# Patient Record
Sex: Male | Born: 1995 | Race: Black or African American | Hispanic: No | Marital: Single | Smoking: Never smoker
Health system: Southern US, Community
[De-identification: ages and names within clinical notes are randomized; demographics above are authoritative.]

## PROBLEM LIST (undated history)

## (undated) DIAGNOSIS — F909 Attention-deficit hyperactivity disorder, unspecified type: Secondary | ICD-10-CM

## (undated) DIAGNOSIS — F4310 Post-traumatic stress disorder, unspecified: Secondary | ICD-10-CM

## (undated) DIAGNOSIS — I10 Essential (primary) hypertension: Secondary | ICD-10-CM

## (undated) HISTORY — DX: Essential (primary) hypertension: I10

---
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
## 2017-03-15 IMAGING — US US ABDOMEN LIMITED
1 series · 14 of 25 positions shown · non-contrast
Comparison: None.

CLINICAL DATA: Elevated transaminase.

EXAM:
US ABDOMEN LIMITED - RIGHT UPPER QUADRANT

[Series 1: us abdomen limited · 0.32mm/px · 14 of 46 slices shown]
[im 1/46]
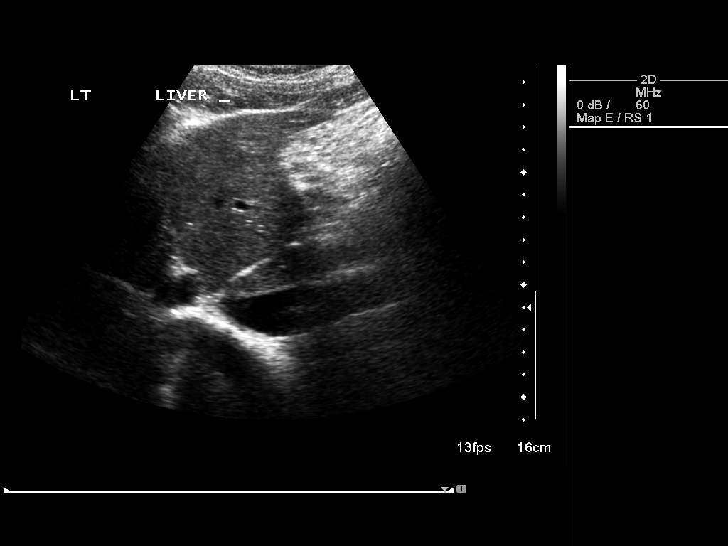
[im 4/46]
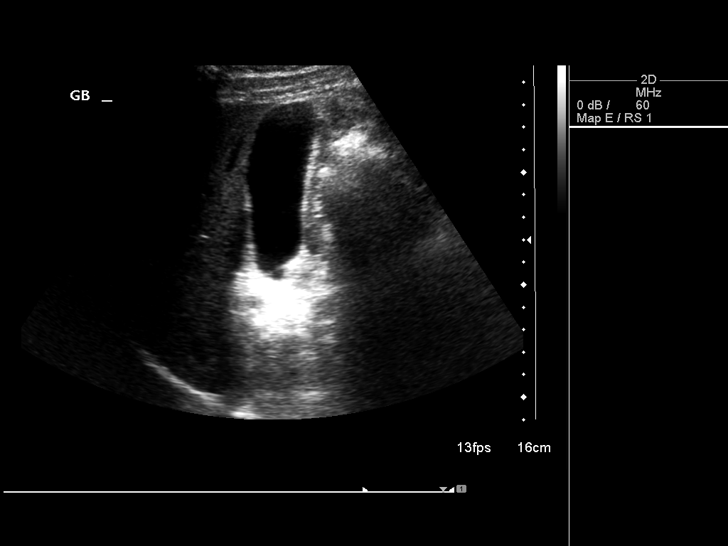
[im 8/46]
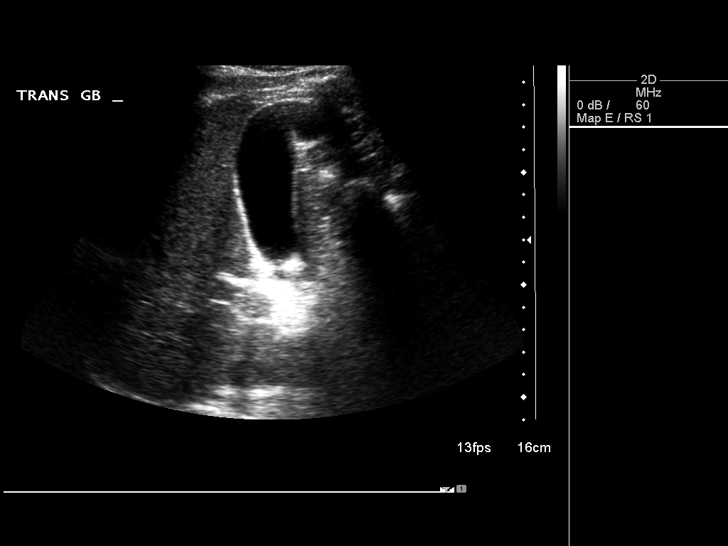
[im 12/46]
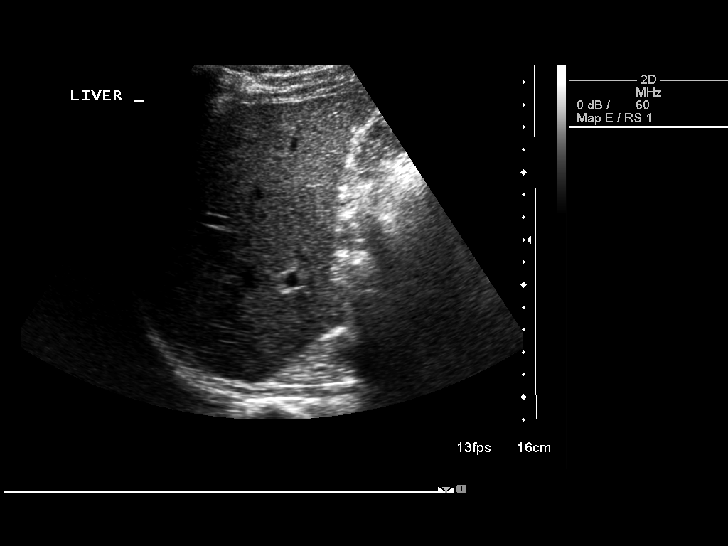
[im 16/46]
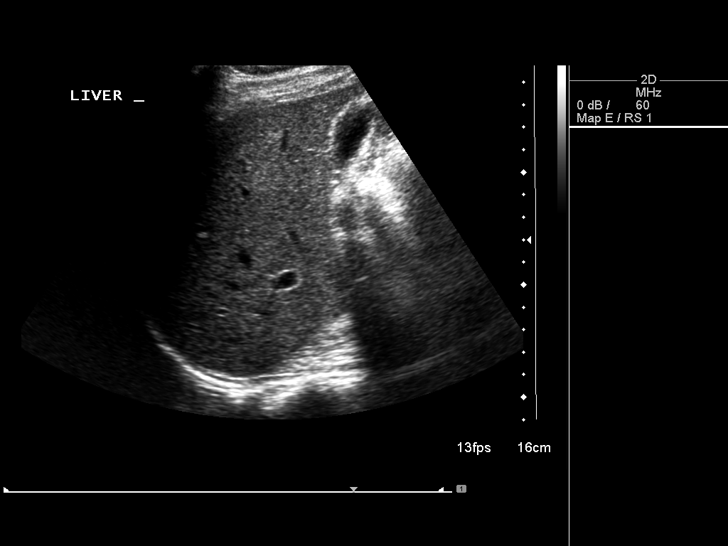
[im 17/46]
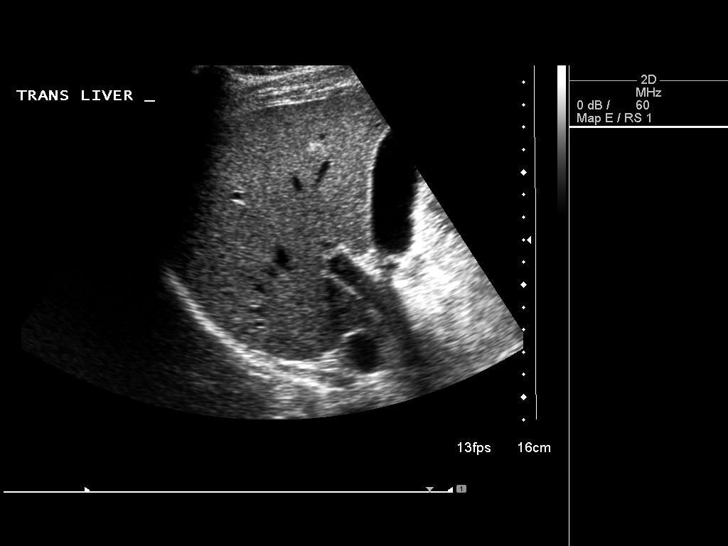
[im 21/46]
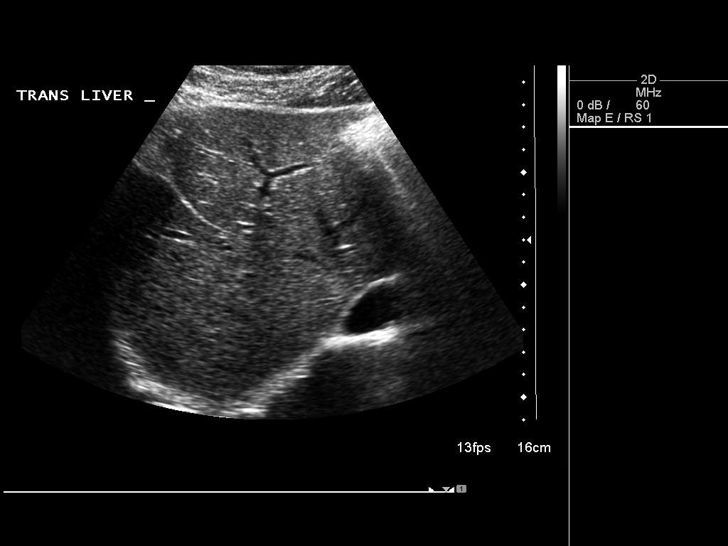
[im 25/46]
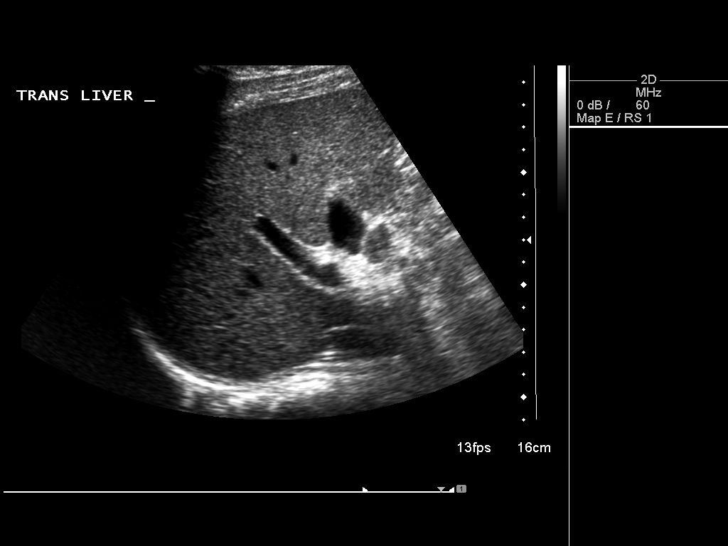
[im 29/46]
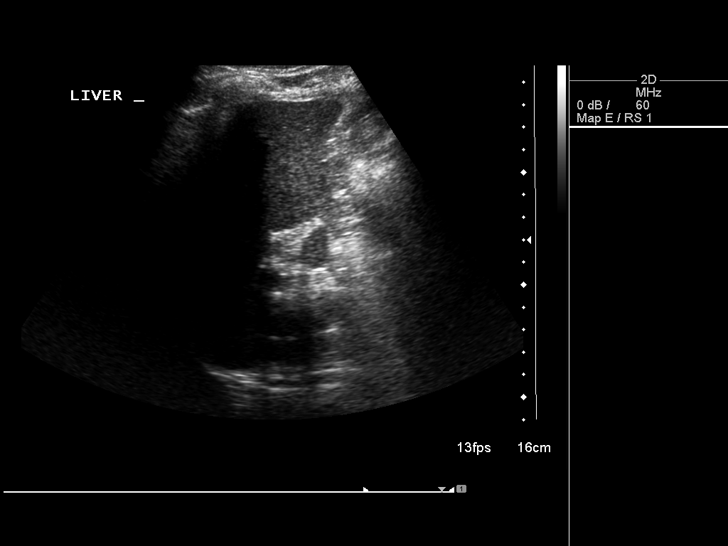
[im 31/46]
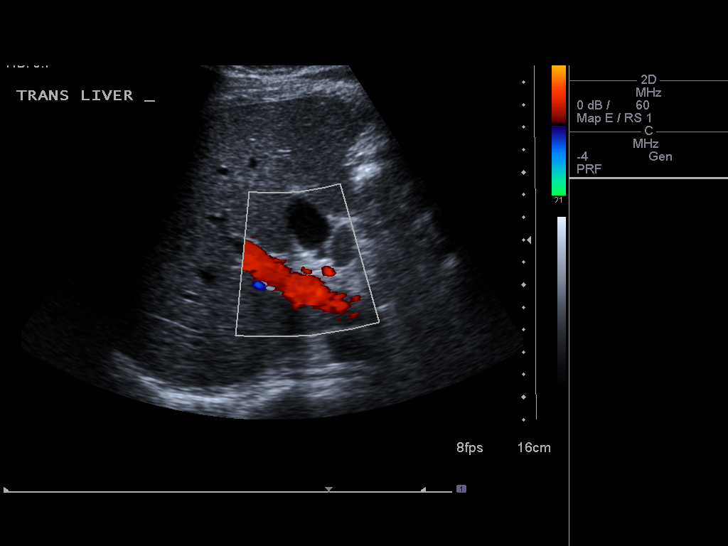
[im 34/46]
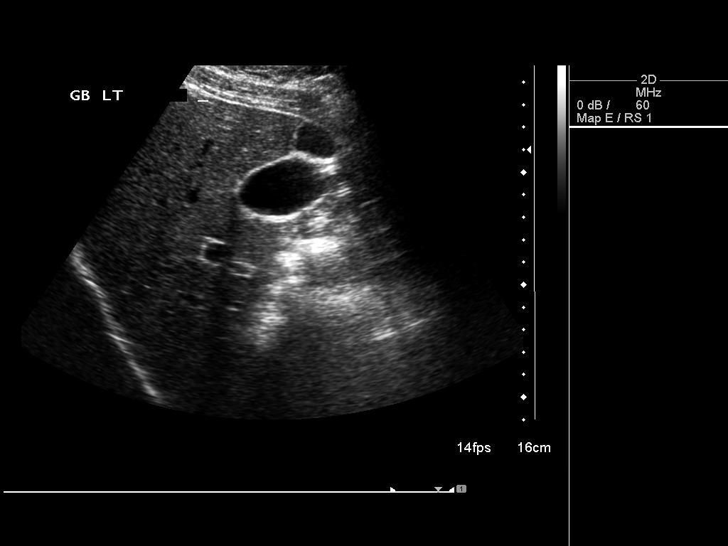
[im 38/46]
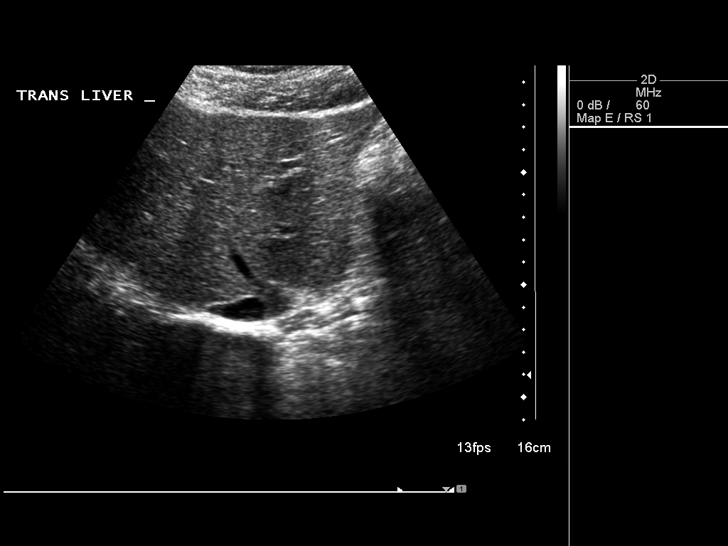
[im 42/46]
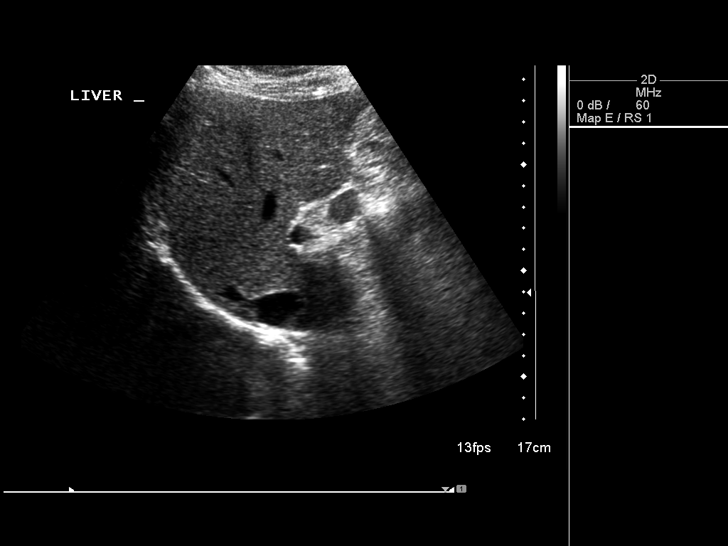
[im 46/46]
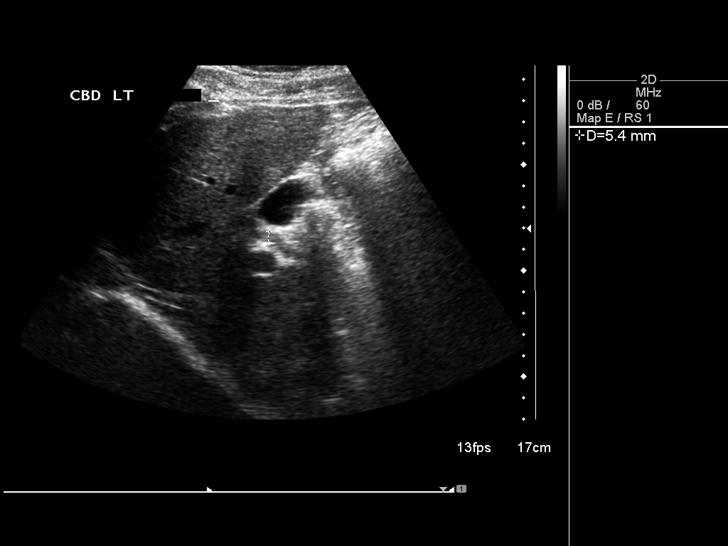

[14 of 25 positions shown; findings below may reference images not displayed]

FINDINGS: Gallbladder:

Gallbladder has a normal appearance. Gallbladder wall is 1.6 mm,
within normal limits. No stones or pericholecystic fluid. No
sonographic Murphy's sign.

Common bile duct:

Diameter: 5.4 mm

Liver:

Liver is homogeneous in echotexture. No focal liver lesions are
identified.

Within the porta hepatis there is a 2 cm solid oval mass consistent
with porta hepatis node.
IMPRESSION: 1. No evidence for acute cholecystitis.
2. No focal liver lesions.
3. 2.0 cm porta hepatis node warrants further evaluation. CT of the
abdomen and pelvis with contrast is recommended.

## 2017-07-23 IMAGING — DX DG HAND COMPLETE 3+V*R*
3 series · 3 of 3 positions shown · non-contrast
Comparison: None.

CLINICAL DATA: Trauma, abrasions to 2nd digit

EXAM:
RIGHT HAND - COMPLETE 3+ VIEW

[hand pa]
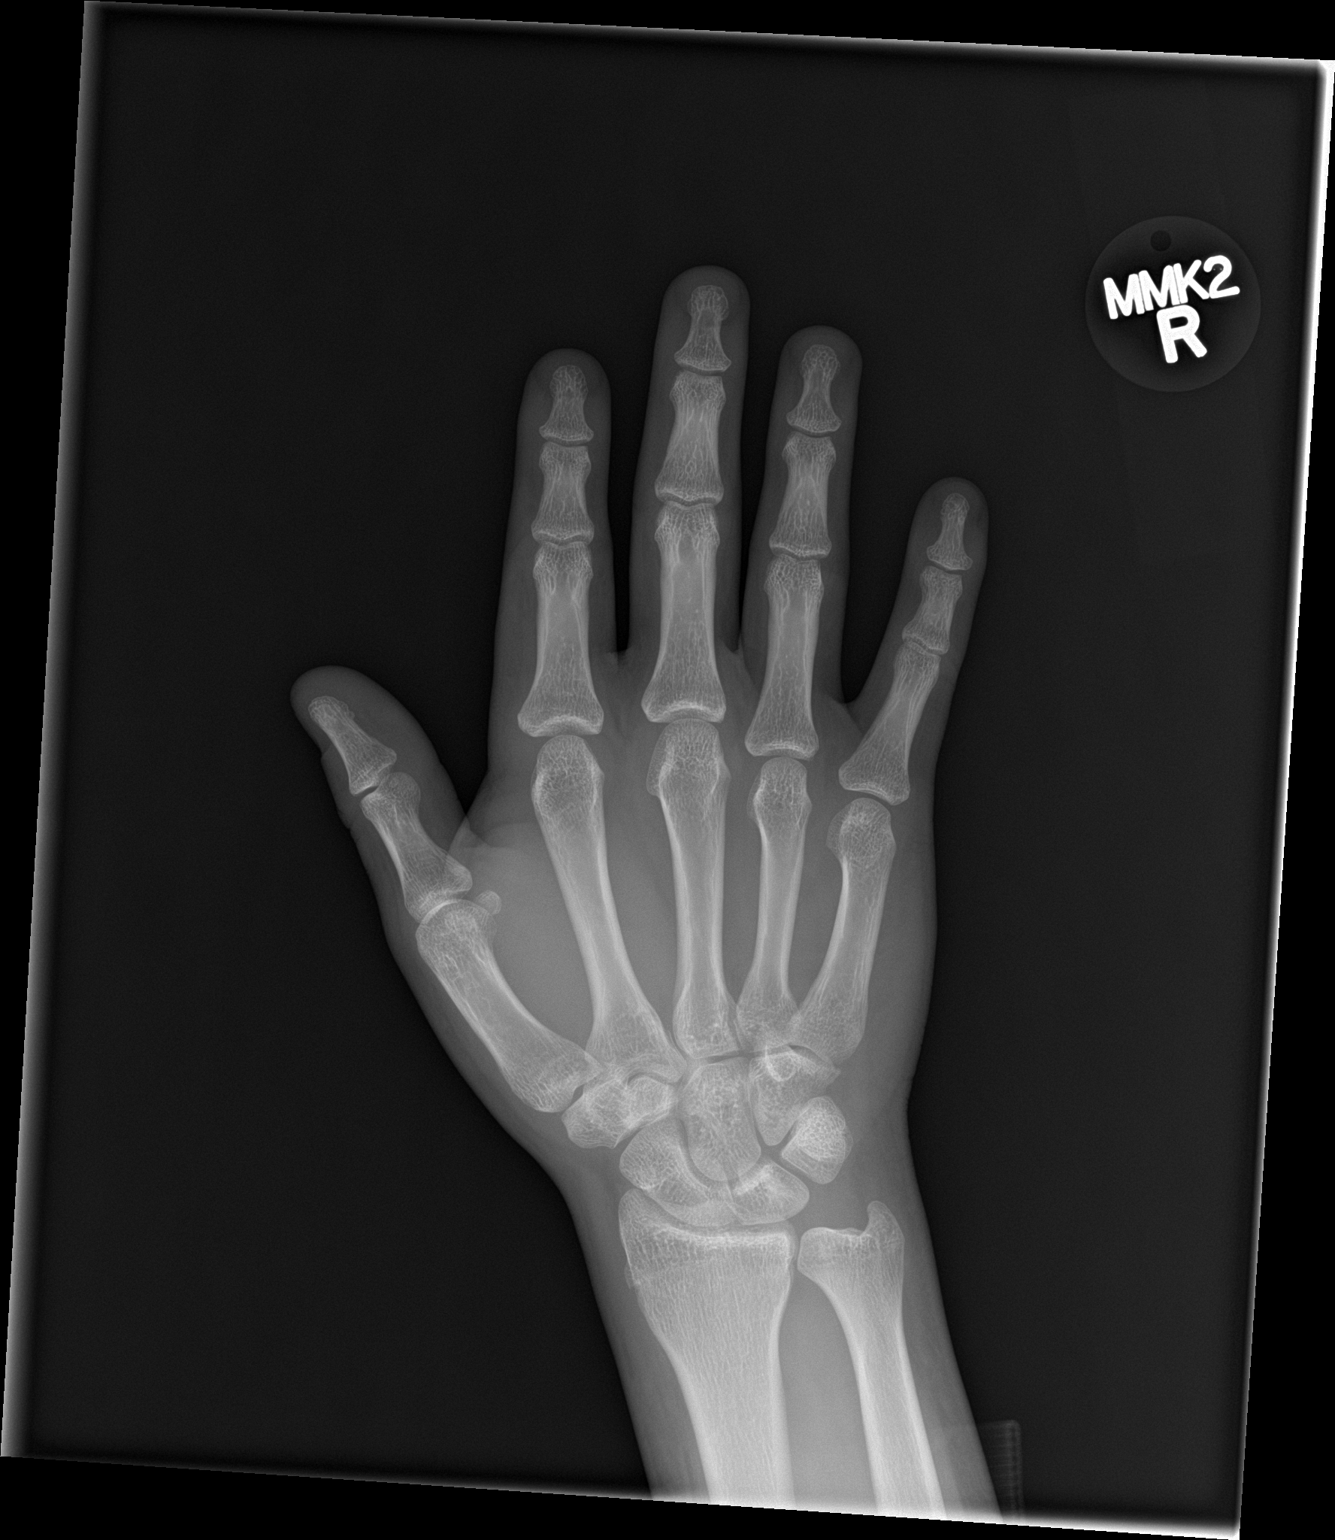

[hand obl]
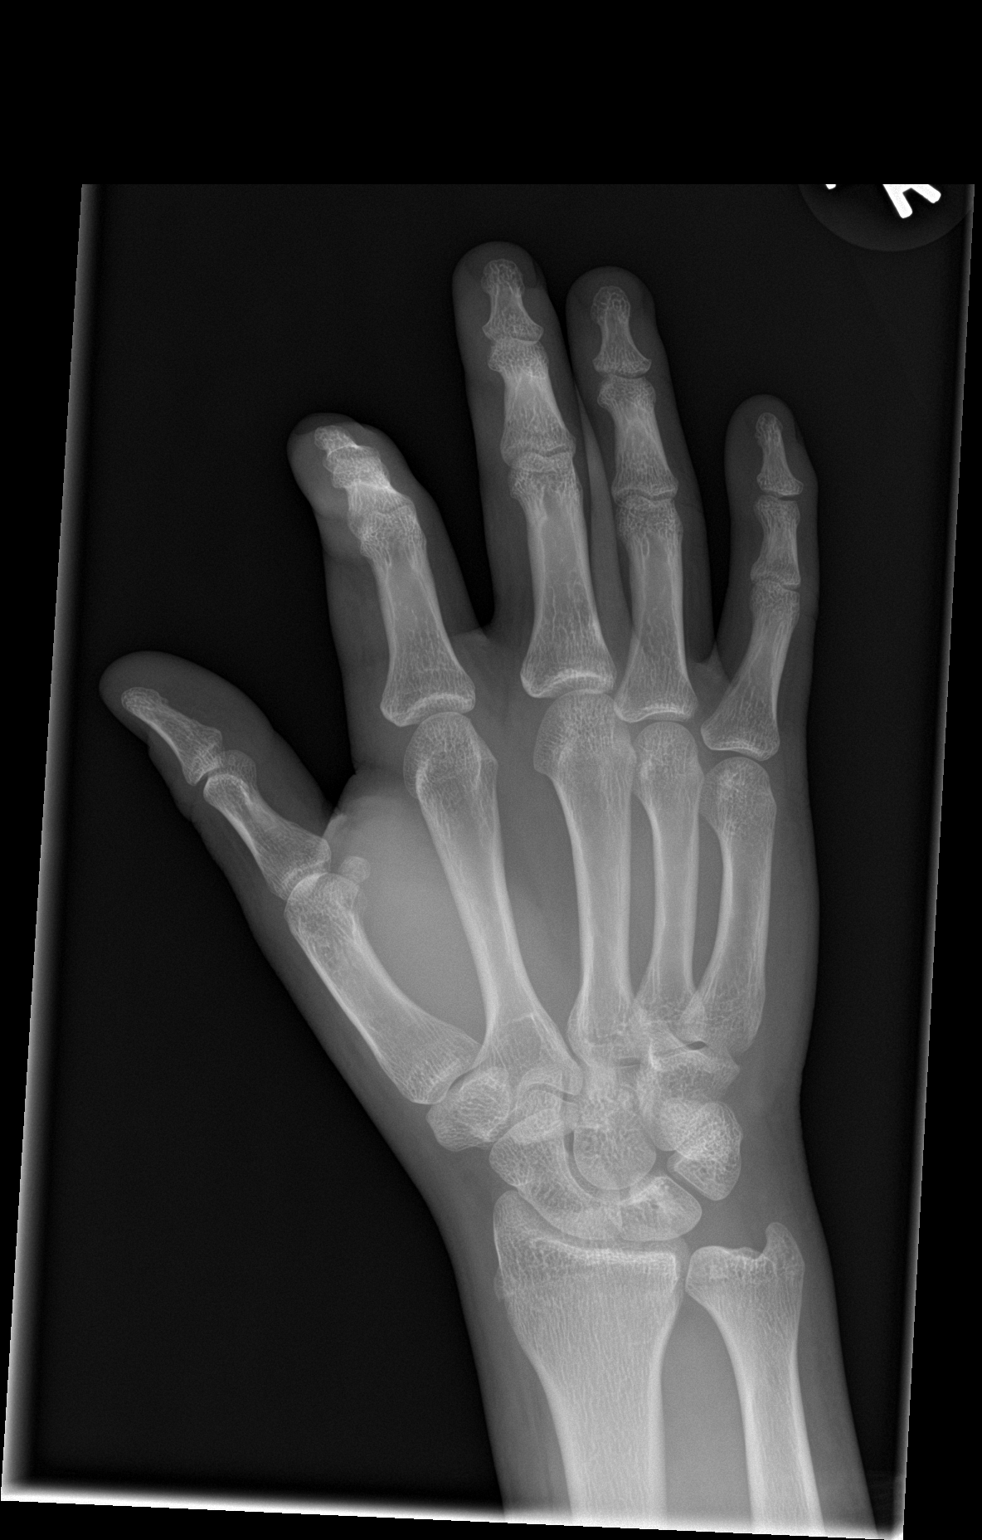

[hand lat]
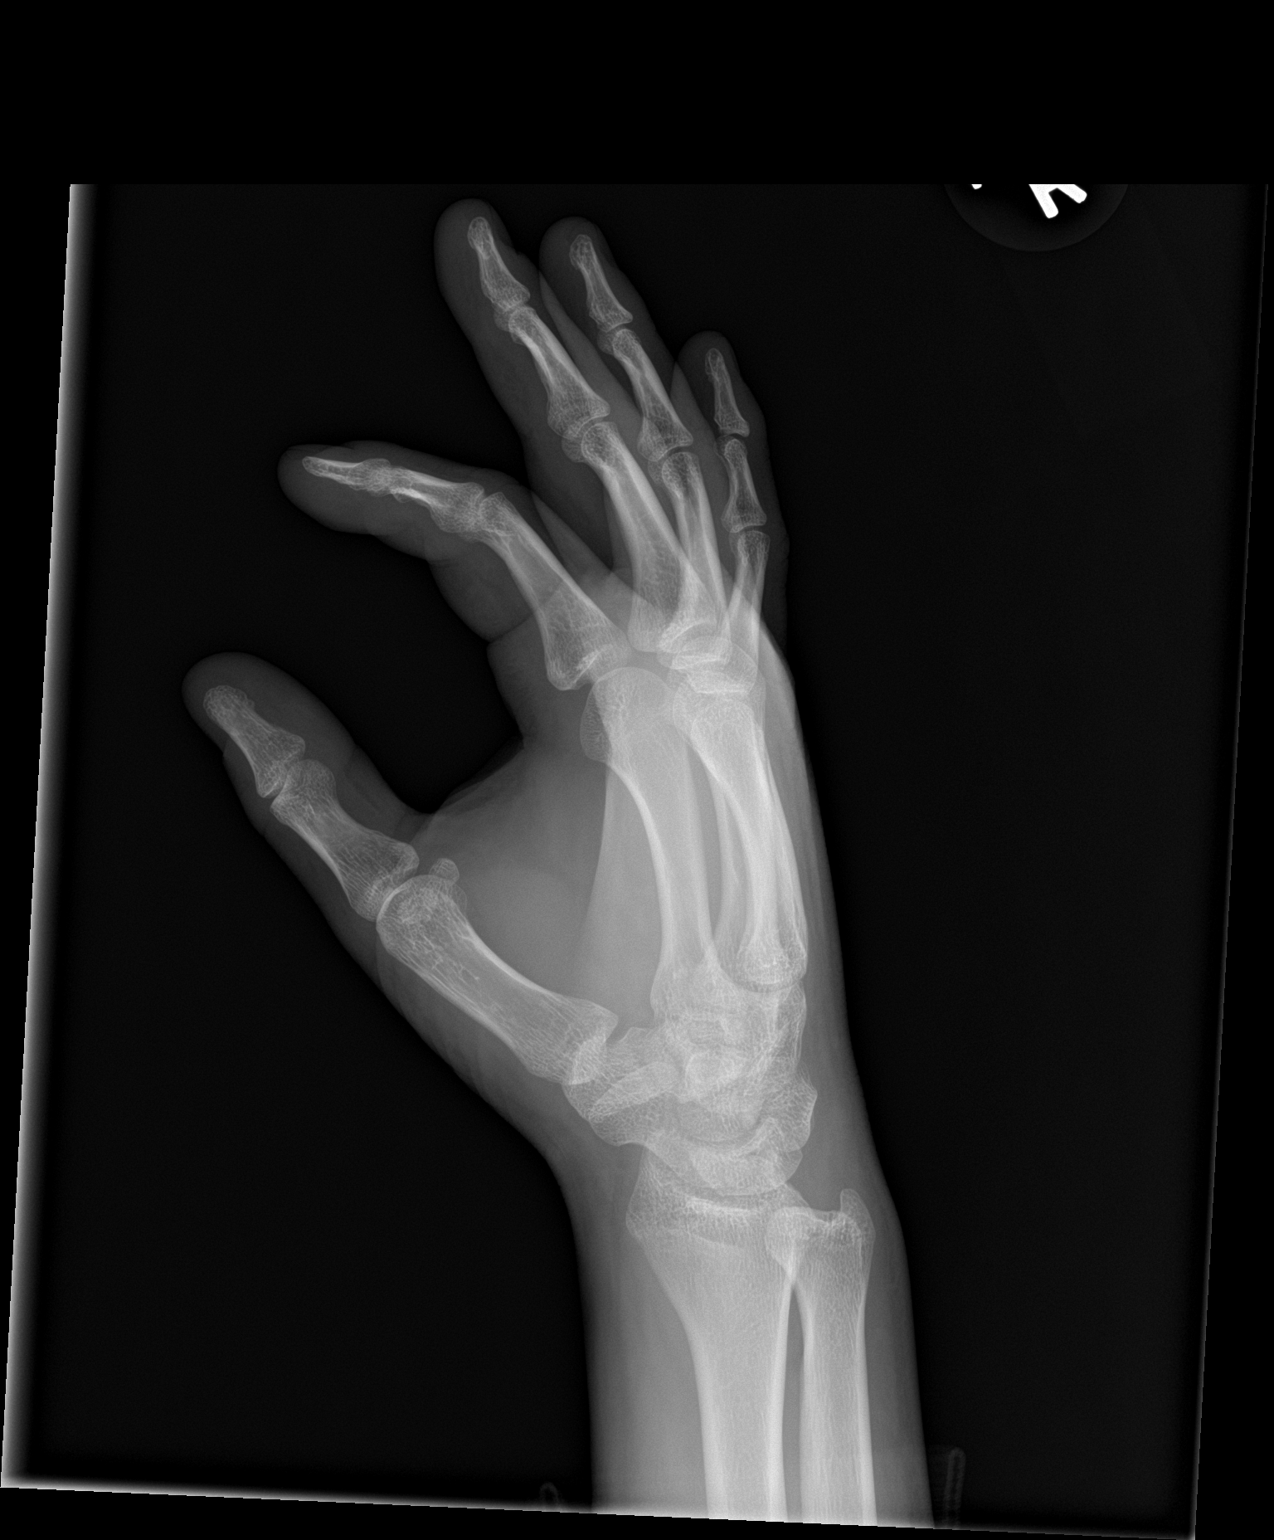

[3 of 3 positions shown; findings below may reference images not displayed]

FINDINGS: No fracture or dislocation is seen.

The joint spaces are preserved.

Possible mild soft tissue swelling along the lateral/radial aspect
of the proximal 2nd digit.

No radiopaque foreign body is seen.
IMPRESSION: No fracture, dislocation, or radiopaque foreign body is seen.

## 2018-05-12 ENCOUNTER — Encounter (INDEPENDENT_AMBULATORY_CARE_PROVIDER_SITE_OTHER): Payer: Self-pay

## 2018-05-12 ENCOUNTER — Encounter (HOSPITAL_BASED_OUTPATIENT_CLINIC_OR_DEPARTMENT_OTHER): Payer: 59

## 2018-05-12 ENCOUNTER — Ambulatory Visit (INDEPENDENT_AMBULATORY_CARE_PROVIDER_SITE_OTHER): Payer: 59 | Admitting: Orthopaedic Surgery

## 2018-05-12 ENCOUNTER — Encounter (INDEPENDENT_AMBULATORY_CARE_PROVIDER_SITE_OTHER): Payer: Self-pay | Admitting: Orthopaedic Surgery

## 2018-05-12 VITALS — BP 139/85 | HR 75 | Ht 73.0 in | Wt 268.7 lb

## 2018-05-12 DIAGNOSIS — M25561 Pain in right knee: Secondary | ICD-10-CM

## 2018-05-12 DIAGNOSIS — M7041 Prepatellar bursitis, right knee: Secondary | ICD-10-CM

## 2018-05-12 DIAGNOSIS — M222X1 Patellofemoral disorders, right knee: Principal | ICD-10-CM

## 2018-05-12 NOTE — Progress Notes (Signed)
Dillon Franklin CR[facility]ING  [address]  [place] [address]       Name: Dillon Franklin MRN:  [id number]   Date: 05/12/2018 Age: 22 y.o.     Chief complaint:   Chief Complaint            Knee Pain right; DOI 05/09/18 while playing football; he states he has had issues with his knee in the past          HPI:  Dillon Franklin  is a 22 y.o. male who is coming in today with Knee Pain (right; DOI 05/09/18 while playing football; he states he has had issues with his knee in the past) as noted by clinical staff.    Patient is a Land at [employer] had off and on knee pain for several years on the right.  No significant injury no previous surgeries.  He has used the role of athletic tape as a patellar tendon strap when he plays.  Recently adult tenderness on his anterior knee after the game couple days ago but that is gone away.  History:  Past Medical History:   Diagnosis Date   . Hypertension          Past Surgical History:   Procedure Laterality Date   . FOOT SURGERY Left 2016         Family Medical History:     Problem Relation (Age of Onset)    Breast Cancer Maternal Grandmother    Cancer Mother            Social History     Socioeconomic History   . Marital status: Single     Spouse name: Not on file   . Number of children: Not on file   . Years of education: Not on file   . Highest education level: Not on file   Tobacco Use   . Smoking status: Never Smoker   . Smokeless tobacco: Never Used     Expanded Substance History     Additional history     No Known Allergies    Medications:   Outpatient Encounter Medications as of 05/12/2018   Medication Sig Dispense Refill   . lisinopril (PRINIVIL) 5 mg Oral Tablet TAKE 1 TABLET BY MOUTH EVERY DAY  0   . [DISCONTINUED] lisinopril (PRINIVIL) 2.5 mg Oral Tablet Take 2.5 mg by mouth Once a day       No facility-administered encounter medications on file as of 05/12/2018.        Review of Systems:  Constitutional: negative  Eyes: negative  Ears, nose,  mouth, throat, and face: negative  Respiratory: negative  Cardiovascular: negative  Gastrointestinal: negative  Integument/breast: negative  Musculoskeletal:negative except for as described in HPI  Neurological: negative  Allergic/Immunologic: negative  Hematologic: negative      Exam:  Vitals: BP 139/85 (Site: Left, Patient Position: Sitting, Cuff Size: Adult Large)   Pulse 75   Ht 1.854 m (6\' 1" ) Comment: patient reported  Wt 121.9 kg (268 lb 11.2 oz)   BMI 35.45 kg/m   99 %ile (Z= 2.22) based on CDC (Boys, 2-20 Years) BMI-for-age based on BMI available as of 05/12/2018.    General: appears stated age and no distress  Eyes: Conjunctiva clear., Pupils equal and round.   HENT:Nose without erythema. , Mouth mucous membranes moist.   Neck: No JVD or thyromegaly and supple, symmetrical, trachea midline  Lungs: Respiratory effort normal and no respiratory distress  Extremities: no cyanosis and  extremities normal, atraumatic except as noted in MS exam  Skin: Skin warm and dry and Skin color, texture, turgor normal. No rashes or lesions  Neurologic: grossly normal, alert and oriented X 3, normal tone, normal coordination and gait, no tremor  Psychiatric: affect normal, behavior normal, thought content normal and speech normal    Ortho Exam:  Right knee:  Nontender to palpation he points over the patella where his pain was.  No bursal effusion at all.  No knee effusion.  Collaterals are stable.  No joint line tenderness.  Negative McMurray's.  Anterior drawer Lachman's 1+.  Mild patellofemoral crepitation with grind maneuver.  Negative patellar apprehension.    IMAGING:   Right knee:  Three views demonstrates significant patella [place].  Patient has a shallow trochlear groove on sunrise view.  Radiographic images were read and interpreted by me.   Dillon Folds, MD  05/12/2018, 09:42    Procedure Note:  \    PRESCRIPTION DRUG MANAGEMENT:   Medication Orders   No Medications ordered   .    ASSESSMENT/PLAN:    ICD-10-CM    1.  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 Right knee pain M25.561 XR KNEE RIGHT STANDING AP/LAT WITH SUNRISE VIEW   3. Bursitis, prepatellar, right M70.41      Orders Placed This Encounter   . XR KNEE RIGHT STANDING AP/LAT WITH SUNRISE VIEW     I have written a note for his athletic trainers to work on [employer] quad strengthening and may consider KT taping of the patella to her improve tracking.  He otherwise looks stable and return on a as needed basis.  All diagnoses and treatment options were discussed including conservative measures vs. surgical options with the patient. Risks, options, benefits, alternatives, and possible complications of the proposed procedures/studies/medical interventions were discussed and understood and accepted by the patient. The patient's questions were answered in the office. The patient understood the recommendations and is agreeable to the plan.     All patients with elevated blood pressure readings this visit were informed of their elevated blood pressure and told to follow up with their PCP ASAP.    Return if symptoms worsen or fail to improve.     Dillon Folds, MD  05/12/2018, 09:42    Portions of this note may be dictated using voice recognition software or a dictation service. Variances in spelling and vocabulary are possible and unintentional. Not all errors are caught/corrected. Please notify the Thereasa Parkin if any discrepancies are noted or if the meaning of any statement is not clear.
